# Patient Record
Sex: Male | Born: 1950 | Race: White | Hispanic: No | Marital: Married
Health system: Southern US, Community
[De-identification: ages and names within clinical notes are randomized; demographics above are authoritative.]

## PROBLEM LIST (undated history)

## (undated) DIAGNOSIS — I2510 Atherosclerotic heart disease of native coronary artery without angina pectoris: Secondary | ICD-10-CM

## (undated) DIAGNOSIS — I214 Non-ST elevation (NSTEMI) myocardial infarction: Secondary | ICD-10-CM

---

## 2017-07-08 ENCOUNTER — Encounter (HOSPITAL_COMMUNITY): Payer: Self-pay

## 2017-07-08 ENCOUNTER — Emergency Department (HOSPITAL_COMMUNITY): Payer: No Typology Code available for payment source

## 2017-07-08 ENCOUNTER — Emergency Department (HOSPITAL_COMMUNITY)
Admission: EM | Admit: 2017-07-08 | Discharge: 2017-07-08 | Disposition: A | Discharge status: Home / Self Care | Payer: No Typology Code available for payment source | Attending: Emergency Medicine | Admitting: Emergency Medicine

## 2017-07-08 DIAGNOSIS — M545 Low back pain: Secondary | ICD-10-CM | POA: Diagnosis present

## 2017-07-08 DIAGNOSIS — N200 Calculus of kidney: Secondary | ICD-10-CM | POA: Insufficient documentation

## 2017-07-08 HISTORY — DX: Non-ST elevation (NSTEMI) myocardial infarction: I21.4

## 2017-07-08 HISTORY — DX: Atherosclerotic heart disease of native coronary artery without angina pectoris: I25.10

## 2017-07-08 LAB — COMPREHENSIVE METABOLIC PANEL
ALT: 22 U/L (ref 17–63)
AST: 23 U/L (ref 15–41)
Albumin: 4.4 g/dL (ref 3.5–5.0)
Alkaline Phosphatase: 74 U/L (ref 38–126)
Anion gap: 11 (ref 5–15)
BUN: 14 mg/dL (ref 6–20)
CO2: 25 mmol/L (ref 22–32)
Calcium: 9.7 mg/dL (ref 8.9–10.3)
Chloride: 105 mmol/L (ref 101–111)
Creatinine, Ser: 1.14 mg/dL (ref 0.61–1.24)
GFR calc Af Amer: >60 mL/min (ref >60)
GFR calc non Af Amer: >60 mL/min (ref >60)
Glucose, Bld: 132 mg/dL — ABNORMAL HIGH (ref 65–99)
Potassium: 3.8 mmol/L (ref 3.5–5.1)
Sodium: 141 mmol/L (ref 135–145)
Total Bilirubin: 1 mg/dL (ref 0.3–1.2)
Total Protein: 7.5 g/dL (ref 6.5–8.1)

## 2017-07-08 LAB — CBC WITH DIFFERENTIAL/PLATELET
Abs Immature Granulocytes: 0 10*3/uL (ref 0.0–0.1)
Basophils Absolute: 0.1 10*3/uL (ref 0.0–0.1)
Basophils Relative: 1 %
Eosinophils Absolute: 0.1 10*3/uL (ref 0.0–0.7)
Eosinophils Relative: 1 %
HCT: 42.3 % (ref 39.0–52.0)
Hemoglobin: 14.9 g/dL (ref 13.0–17.0)
Immature Granulocytes: 0 %
Lymphocytes Relative: 16 %
Lymphs Abs: 1.2 10*3/uL (ref 0.7–4.0)
MCH: 31.6 pg (ref 26.0–34.0)
MCHC: 35.2 g/dL (ref 30.0–36.0)
MCV: 89.6 fL (ref 78.0–100.0)
Monocytes Absolute: 0.5 10*3/uL (ref 0.1–1.0)
Monocytes Relative: 7 %
Neutro Abs: 5.5 10*3/uL (ref 1.7–7.7)
Neutrophils Relative %: 75 %
Platelets: 186 10*3/uL (ref 150–400)
RBC: 4.72 MIL/uL (ref 4.22–5.81)
RDW: 13.5 % (ref 11.5–15.5)
WBC: 7.2 10*3/uL (ref 4.0–10.5)

## 2017-07-08 LAB — URINALYSIS, ROUTINE W REFLEX MICROSCOPIC
BILIRUBIN URINE: NEGATIVE
Bacteria, UA: NONE SEEN
GLUCOSE, UA: NEGATIVE mg/dL
Ketones, ur: 5 mg/dL — AB
Leukocytes, UA: NEGATIVE
Nitrite: NEGATIVE
Protein, ur: NEGATIVE mg/dL
RBC / HPF: >50 RBC/hpf — ABNORMAL HIGH (ref 0–5)
Specific Gravity, Urine: >1.046 — ABNORMAL HIGH (ref 1.005–1.030)
pH: 6 (ref 5.0–8.0)

## 2017-07-08 MED ORDER — MORPHINE SULFATE (PF) 4 MG/ML IV SOLN
4.0000 mg | Freq: Once | INTRAVENOUS | Status: AC
  Administered 2017-07-08: 4 mg via INTRAVENOUS
  Filled 2017-07-08: qty 1

## 2017-07-08 MED ORDER — HYDROCODONE-ACETAMINOPHEN 5-325 MG PO TABS: 1.0000 | ORAL_TABLET | Freq: Four times a day (QID) | ORAL | 0 refills | Status: AC | PRN

## 2017-07-08 MED ORDER — IOPAMIDOL (ISOVUE-M 300) INJECTION 61%: 15.0000 mL | Freq: Once | INTRAMUSCULAR | Status: DC | PRN

## 2017-07-08 MED ORDER — IOHEXOL 300 MG/ML  SOLN
100.0000 mL | Freq: Once | INTRAMUSCULAR | Status: AC | PRN
Start: 2017-07-08 — End: 2017-07-08
  Administered 2017-07-08: 100 mL via INTRAVENOUS

## 2017-07-08 MED ORDER — TAMSULOSIN HCL 0.4 MG PO CAPS: 0.4000 mg | ORAL_CAPSULE | Freq: Every day | ORAL | 0 refills | Status: AC
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
## 2017-07-08 NOTE — Discharge Instructions (Signed)
You have a 2mm kidney stone on the right side causing pain.  You will likely able to pass this stone.  Follow up with urologist as needed for further care.
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
## 2017-07-08 NOTE — ED Notes (Signed)
PT talking to wife at bedside

## 2017-07-08 NOTE — ED Notes (Signed)
Patient transported to CT 

## 2017-07-08 NOTE — ED Notes (Signed)
Patient vomited approximately 250cc while IV being started, alert and oriented, states that he has a lot of stress with other medical problems the last few weeks

## 2017-07-08 NOTE — ED Notes (Signed)
[employer] at bedside 
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
## 2017-07-08 NOTE — ED Notes (Signed)
Patient able to ambulate independently  

## 2018-11-05 IMAGING — CT CT ABD-PELV W/ CM
2 of 5 series · 15 of 46 positions shown, 17 images · IV contrast (APPLIED)
Comparison: None.

CLINICAL DATA: Abdominal and back pain

EXAM:
CT ABDOMEN AND PELVIS WITH CONTRAST
TECHNIQUE: Multidetector CT imaging of the abdomen and pelvis was performed
using the standard protocol following bolus administration of
intravenous contrast.
CONTRAST:  100mL OMNIPAQUE IOHEXOL 300 MG/ML  SOLN

[Series 3: abd/ pelvis 5.0 i30f 2 · axial · 0.78mm/px · z∈[+756,+1162]mm · 12 of 93 slices shown, 14 images]
[im 6/93  soft-tissue]
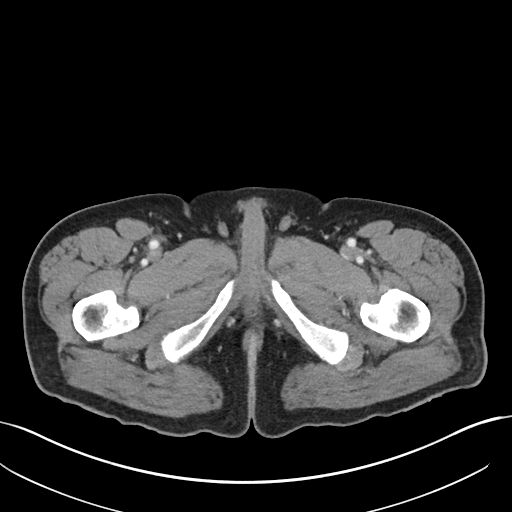
[im 6/93  bone]
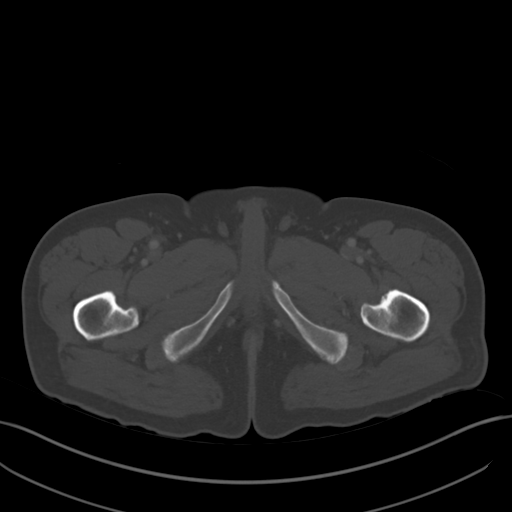
[im 12/93  soft-tissue]
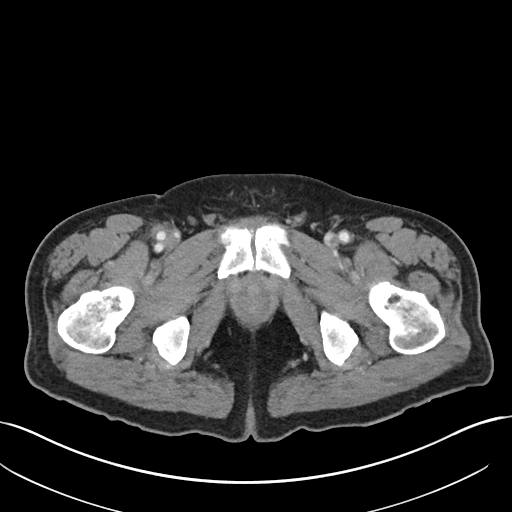
[im 24/93  soft-tissue]
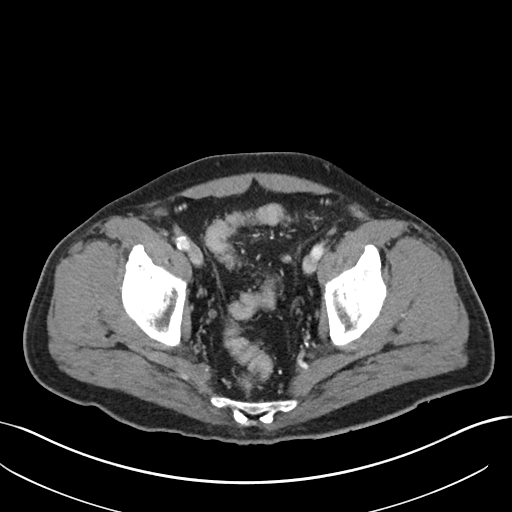
[im 29/93  soft-tissue]
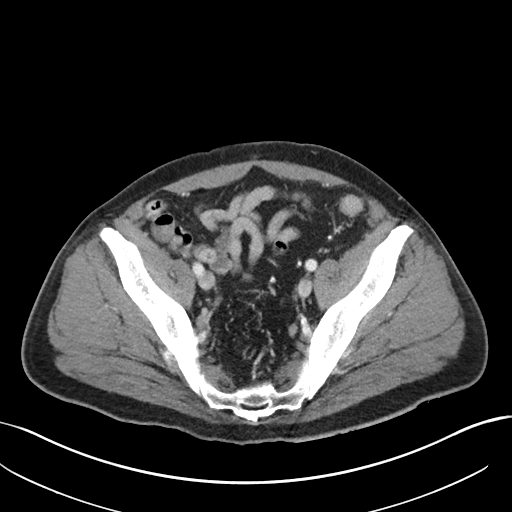
[im 35/93  soft-tissue]
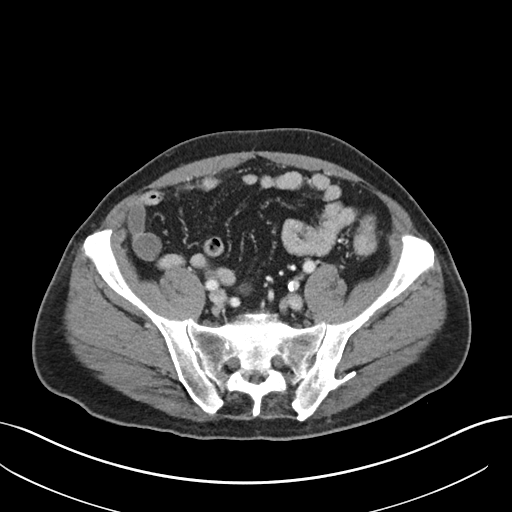
[im 41/93  soft-tissue]
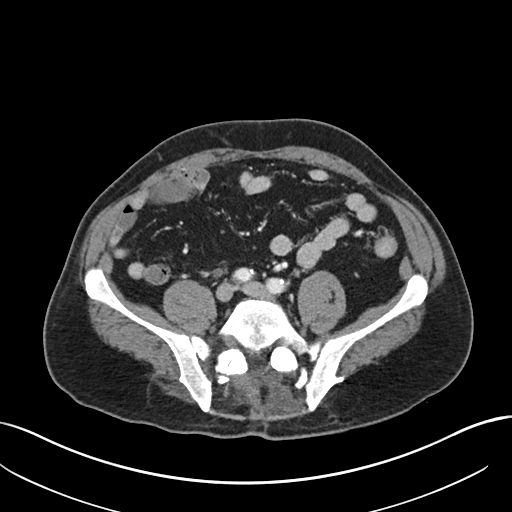
[im 52/93  soft-tissue]
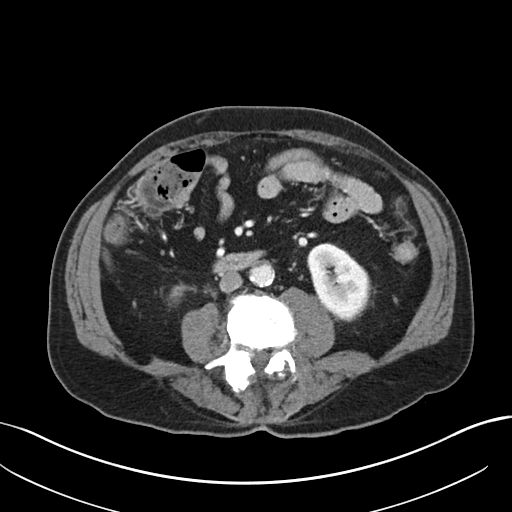
[im 58/93  soft-tissue]
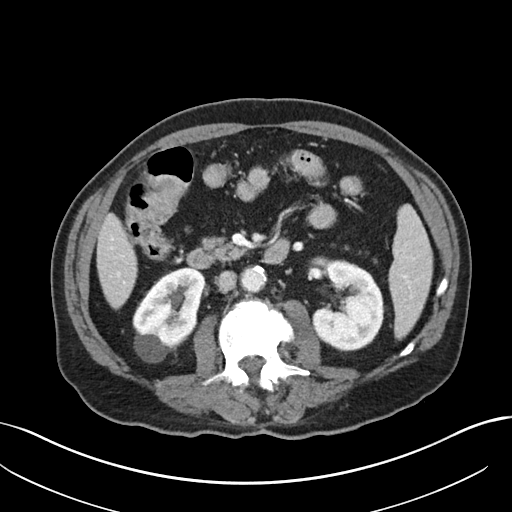
[im 64/93  soft-tissue]
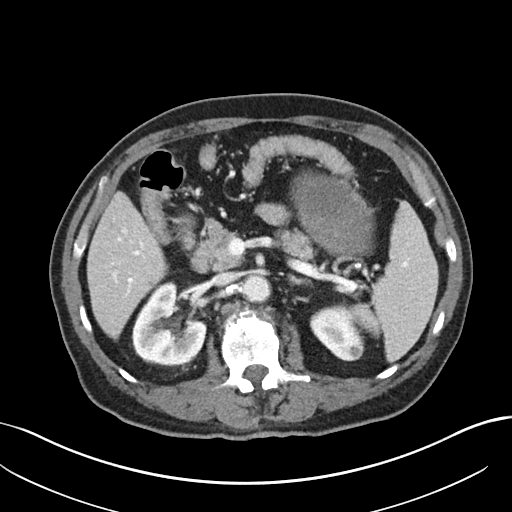
[im 64/93  bone]
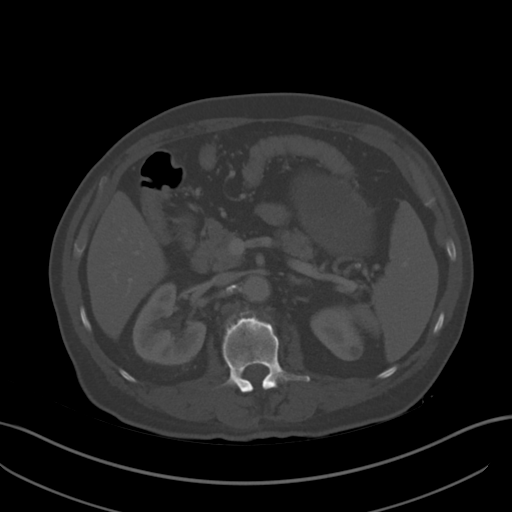
[im 70/93  soft-tissue]
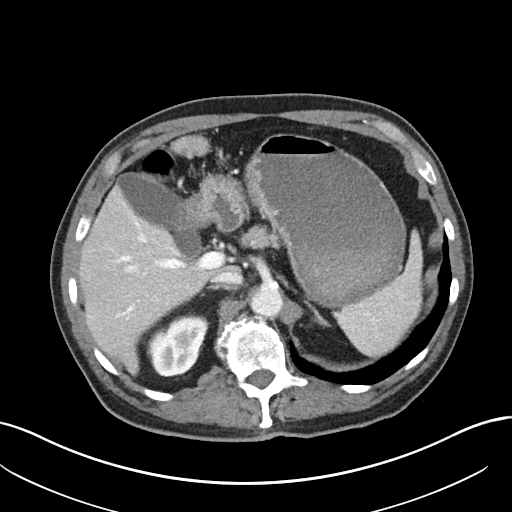
[im 81/93  soft-tissue]
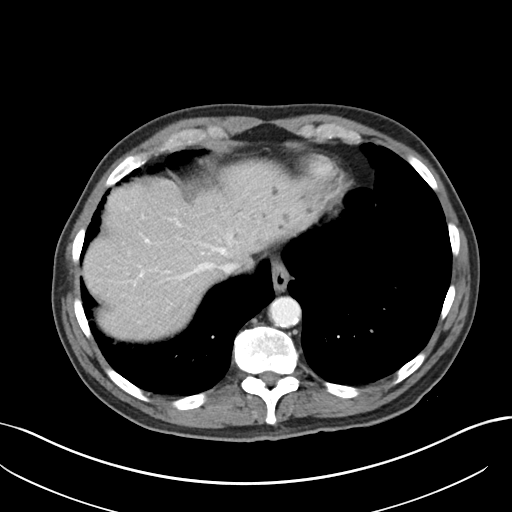
[im 87/93  soft-tissue]
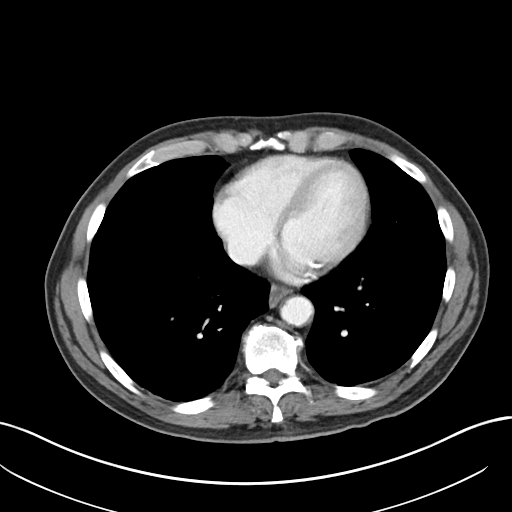

[Series 6: coronal soft tissue · coronal · 0.71mm/px · 3 of 98 slices shown]
[im 33/98  soft-tissue]
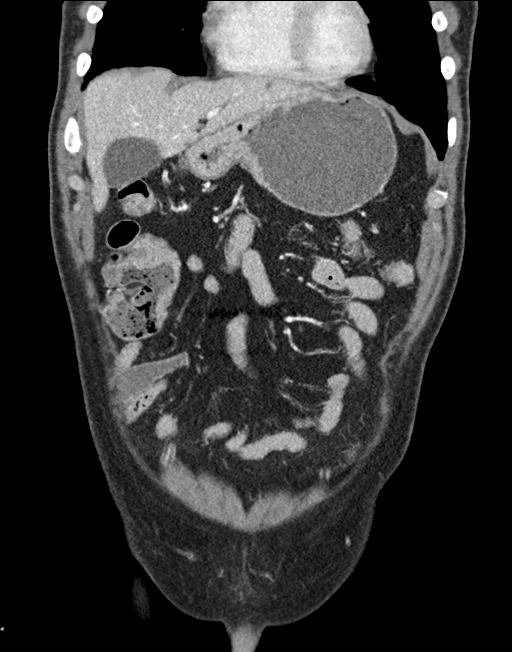
[im 44/98  soft-tissue]
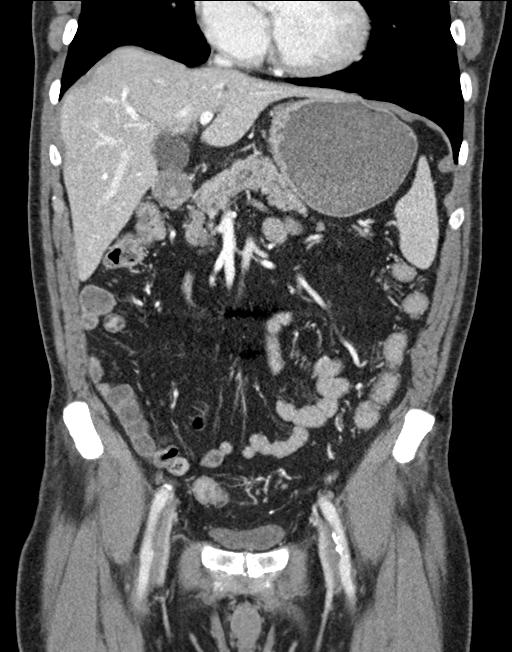
[im 54/98  soft-tissue]
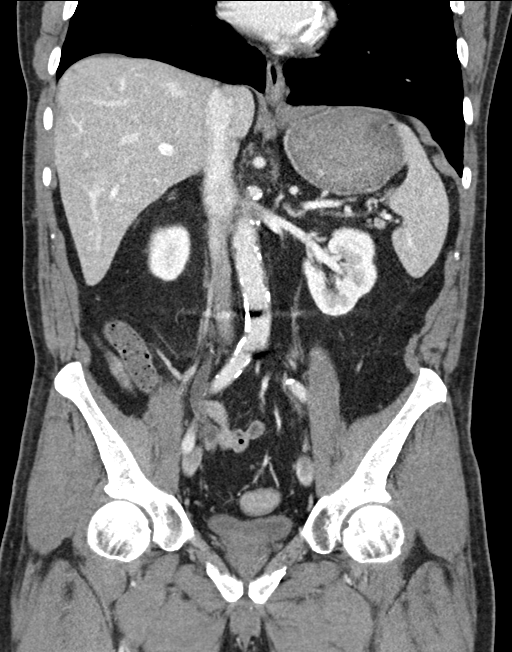

[15 of 46 positions shown; findings below may reference images not displayed]

FINDINGS: Lower chest: There is right base atelectasis. No lung base edema or
consolidation. There are foci of coronary artery calcification.

Hepatobiliary: There are tiny probable cysts throughout the liver.
No noncystic appearing liver lesions are evident. Gallbladder wall
is not appreciably thickened. There is no appreciable biliary duct
dilatation.

Pancreas: No pancreatic mass or inflammatory focus.

Spleen: No splenic lesions are evident.

Adrenals/Urinary Tract: No adrenal lesions are evident. There is a
cyst arising from the posterior lower pole right kidney measuring
2.5 x 2.3 cm. There is an 8 x 8 mm cyst arising from the posterior
upper to mid right kidney. There is mild hydronephrosis on the
right. There is no appreciable hydronephrosis on the left. There is
no intrarenal calculus on either side. There is a 2 mm calculus at
the right ureterovesical junction causing obstruction on the right.
No other ureteral calculi are evident on either side. Urinary
bladder is nearly empty. Urinary bladder wall is felt to be within
normal limits for nearly empty bladder.

Stomach/Bowel: There is no appreciable bowel wall or mesenteric
thickening. There is no evident bowel obstruction. No free air or
portal venous air.

Vascular/Lymphatic: There is atherosclerotic calcification and
plaque in the aorta and common iliac arteries. No evident aneurysm.
Major mesenteric arterial vessels appear patent. There is a
circumaortic left renal vein, an anatomic variant. There is no
appreciable adenopathy in the abdomen or pelvis.

Reproductive: Prostate and seminal vesicles are normal in size and
contour. No evident pelvic mass.

Other: Appendix is diminutive. No appendiceal inflammation evident.
No ascites or abscess is evident in the abdomen or pelvis.

Musculoskeletal: There is posterior screw and plate fixation at L4
and L5. Support hardware intact. There is extensive multilevel
arthropathy throughout the lumbar spine. Extensive post laminectomy
defects throughout the lumbar region. Postoperative fusion defect
along the lateral left iliac crest. No blastic or lytic bone lesions
are evident. There is a benign exostosis arising from the inferior
left iliac crest without surrounding soft tissue fluid or edema. No
intramuscular lesions are evident. No abdominal wall lesions are
evident.
IMPRESSION: 1. 2 mm calculus at the right ureterovesical junction causing mild
hydronephrosis and ureterectasis on the right.

2.No bowel obstruction. No evident abscess in the abdomen or pelvis.
No periappendiceal region inflammation.

3. There is aortoiliac atherosclerosis. There is also coronary
artery calcification. No aneurysm evident.

4. Postoperative change throughout the lumbar spine posteriorly.
Extensive multilevel lumbar arthropathy. No blastic or lytic bone
lesions evident.
